# Patient Record
Sex: Male | Born: 1965 | Race: Black or African American | Hispanic: No | Marital: Single | Smoking: Never smoker
Health system: Southern US, Community
[De-identification: ages and names within clinical notes are randomized; demographics above are authoritative.]

---

## 2006-06-18 ENCOUNTER — Emergency Department: Payer: Self-pay | Admitting: Emergency Medicine

## 2010-03-10 ENCOUNTER — Emergency Department: Payer: Self-pay | Admitting: Emergency Medicine

## 2010-10-10 ENCOUNTER — Emergency Department: Payer: Self-pay | Admitting: Emergency Medicine

## 2015-01-28 DIAGNOSIS — D649 Anemia, unspecified: Secondary | ICD-10-CM | POA: Insufficient documentation

## 2015-03-06 DIAGNOSIS — E538 Deficiency of other specified B group vitamins: Secondary | ICD-10-CM | POA: Insufficient documentation

## 2016-06-07 ENCOUNTER — Emergency Department: Payer: Managed Care, Other (non HMO)

## 2016-06-07 ENCOUNTER — Emergency Department
Admission: EM | Admit: 2016-06-07 | Discharge: 2016-06-07 | Disposition: A | Discharge status: Home / Self Care | Payer: Managed Care, Other (non HMO) | Attending: Emergency Medicine | Admitting: Emergency Medicine

## 2016-06-07 ENCOUNTER — Encounter: Payer: Self-pay | Admitting: Emergency Medicine

## 2016-06-07 DIAGNOSIS — J40 Bronchitis, not specified as acute or chronic: Secondary | ICD-10-CM | POA: Diagnosis not present

## 2016-06-07 DIAGNOSIS — R05 Cough: Secondary | ICD-10-CM

## 2016-06-07 DIAGNOSIS — M940 Chondrocostal junction syndrome [Tietze]: Secondary | ICD-10-CM

## 2016-06-07 DIAGNOSIS — R059 Cough, unspecified: Secondary | ICD-10-CM

## 2016-06-07 MED ORDER — ALBUTEROL SULFATE HFA 108 (90 BASE) MCG/ACT IN AERS: 2.0000 | INHALATION_SPRAY | Freq: Four times a day (QID) | RESPIRATORY_TRACT | 0 refills | Status: DC | PRN

## 2016-06-07 MED ORDER — KETOROLAC TROMETHAMINE 60 MG/2ML IM SOLN
60.0000 mg | Freq: Once | INTRAMUSCULAR | Status: AC
  Administered 2016-06-07: 60 mg via INTRAMUSCULAR
  Filled 2016-06-07: qty 2

## 2016-06-07 MED ORDER — BENZONATATE 100 MG PO CAPS
100.0000 mg | ORAL_CAPSULE | Freq: Once | ORAL | Status: AC
  Administered 2016-06-07: 100 mg via ORAL
  Filled 2016-06-07: qty 1

## 2016-06-07 MED ORDER — PREDNISONE 20 MG PO TABS: 60.0000 mg | ORAL_TABLET | Freq: Every day | ORAL | 0 refills | Status: DC

## 2016-06-07 MED ORDER — IPRATROPIUM-ALBUTEROL 0.5-2.5 (3) MG/3ML IN SOLN
3.0000 mL | Freq: Once | RESPIRATORY_TRACT | Status: AC
  Administered 2016-06-07: 3 mL via RESPIRATORY_TRACT
  Filled 2016-06-07: qty 3

## 2016-06-07 MED ORDER — PREDNISONE 20 MG PO TABS
60.0000 mg | ORAL_TABLET | Freq: Once | ORAL | Status: AC
  Administered 2016-06-07: 60 mg via ORAL
  Filled 2016-06-07: qty 3

## 2016-06-07 MED ORDER — OXYMETAZOLINE HCL 0.05 % NA SOLN
1.0000 | Freq: Once | NASAL | Status: AC
  Administered 2016-06-07: 1 via NASAL
  Filled 2016-06-07: qty 15

## 2016-06-07 MED ORDER — BENZONATATE 100 MG PO CAPS: 100.0000 mg | ORAL_CAPSULE | Freq: Four times a day (QID) | ORAL | 0 refills | Status: DC | PRN

## 2016-06-07 NOTE — ED Triage Notes (Signed)
Pt presents to ED with congestion and non-productive cough for the past week. Denies fever at home. Denies nausea or vomiting. +headache. pt reports he has not been sleeping well at home  Pt currently has no increased work of breathing or acute distress noted at this time.

## 2016-06-07 NOTE — ED Provider Notes (Signed)
[facility] Emergency Department Provider Note   ____________________________________________   First MD Initiated Contact with Patient 06/07/16 [phone]     (approximate)  I have reviewed the triage vital signs and the nursing notes.   HISTORY  Chief Complaint Cough and Headache    HPI Ricardo Singleton is a 50 y.o. male who comes into the hospital today with a cough. He reports he has been coughing all week long. He reports he cough so bad tonight that he couldn't catch his breath so he decided to come into the hospital to get checked out. The patient has been taking Delsym, Robitussin, cough medicine and mucus relief and he reports that nothing is been helping. He reports that he's also had some headache with some chest soreness. He denies any fever but has had some nasal and sinus congestion. The patient denies some nausea or vomiting. He rates his headache a 7-8 out of 10 in intensity in his chest discomfort a 4-5 out of 10. He reports that the chest hurts in the middle of his chest and is worse when he is coughing. The patient denies any sick contacts. He reports this chest discomfort as soreness more than anything. The patient decided to come into the hospital to get checked out.   History reviewed. No pertinent past medical history.  There are no active problems to display for this patient.   History reviewed. No pertinent surgical history.  Prior to Admission medications   Medication Sig Start Date End Date Taking? Authorizing Provider  albuterol (PROVENTIL HFA;VENTOLIN HFA) 108 (90 Base) MCG/ACT inhaler Inhale 2 puffs into the lungs every 6 (six) hours as needed for wheezing or shortness of breath. 06/07/16   Rebecka ApleyAllison P Webster, MD  benzonatate (TESSALON PERLES) 100 MG capsule Take 1 capsule (100 mg total) by mouth every 6 (six) hours as needed for cough. 06/07/16   Rebecka ApleyAllison P Webster, MD  predniSONE (DELTASONE) 20 MG tablet Take 3 tablets (60 mg total) by  mouth daily. 06/07/16   Rebecka ApleyAllison P Webster, MD    Allergies Review of patient's allergies indicates no known allergies.  No family history on file.  Social History Social History  Substance Use Topics  . Smoking status: Never Smoker  . Smokeless tobacco: Current User  . Alcohol use No    Review of Systems Constitutional: No fever/chills Eyes: No visual changes. ENT: No sore throat. Cardiovascular:  chest pain. Respiratory: Cough and shortness of breath. Gastrointestinal: No abdominal pain.  No nausea, no vomiting.  No diarrhea.  No constipation. Genitourinary: Negative for dysuria. Musculoskeletal: Negative for back pain. Skin: Negative for rash. Neurological: Headache  10-point ROS otherwise negative.  ____________________________________________   PHYSICAL EXAM:  VITAL SIGNS: ED Triage Vitals  Enc Vitals Group     BP 06/07/16 0547 (!) 162/103     Pulse Rate 06/07/16 0547 63     Resp 06/07/16 0547 18     Temp 06/07/16 0547 97.9 F (36.6 C)     Temp Source 06/07/16 0547 Oral     SpO2 06/07/16 0547 98 %     Weight 06/07/16 0548 170 lb (77.1 kg)     Height 06/07/16 0548 5\' 7"  (1.702 m)     Head Circumference --      Peak Flow --      Pain Score 06/07/16 0550 7     Pain Loc --      Pain Edu? --      Excl. in GC? --  Constitutional: Alert and oriented. Well appearing and in Mild distress. Eyes: Conjunctivae are normal. PERRL. EOMI. Head: Atraumatic. Nose: No congestion/rhinnorhea. Mouth/Throat: Mucous membranes are moist.  Oropharynx non-erythematous. Cardiovascular: Normal rate, regular rhythm. Grossly normal heart sounds.  Good peripheral circulation. Respiratory: Normal respiratory effort.  No retractions. Mildly diminished. Chest tenderness to palpation Gastrointestinal: Soft and nontender. No distention. No abdominal bruits. No CVA tenderness. Musculoskeletal: No lower extremity tenderness nor edema.   Neurologic:  Normal speech and language.  Skin:   Skin is warm, dry and intact. Psychiatric: Mood and affect are normal.   ____________________________________________   LABS (all labs ordered are listed, but only abnormal results are displayed)  Labs Reviewed - No data to display ____________________________________________  EKG  None ____________________________________________  RADIOLOGY  Chest x-ray ____________________________________________   PROCEDURES  Procedure(s) performed: None  Procedures  Critical Care performed: No  ____________________________________________   INITIAL IMPRESSION / ASSESSMENT AND PLAN / ED COURSE  Pertinent labs & imaging results that were available during my care of the patient were reviewed by me and considered in my medical decision making (see chart for details).  This is a 50 year old male who comes into the hospital today with some cough and sinus congestion as well as some shortness of breath after coughing and chest discomfort after coughing. The patient had a chest x-ray done which did not show any pneumonia. As the patient had some mildly diminished sounds at the give him a DuoNeb treatment as well as some prednisone, Toradol and benzonatate. The patient did not have any significant coughing while here in the emergency department. After the breathing treatment the patient had some mild wheezing and reports it is breathing is a little bit improved. He still has no significant coughing. I feel the patient has some bronchitis but as he is not a smoker and he does not have any pneumonia I do not feel like he needs an antibiotic. I will discharge the patient some steroids as well as an inhaler and he will also receive some cough medicine. The patient will be discharged to follow-up with the acute care clinic this week. The patient has no further questions or concerns reports that he is ready to go home.  Clinical Course  Value Comment By Time  DG Chest 2 View Mild elevation of the  left hemidiaphragm. Lungs remain grossly clear   Rebecka Apley, MD 09/03 [phone]     ____________________________________________   FINAL CLINICAL IMPRESSION(S) / ED DIAGNOSES  Final diagnoses:  Bronchitis  Cough  Costochondritis      NEW MEDICATIONS STARTED DURING THIS VISIT:  New Prescriptions   ALBUTEROL (PROVENTIL HFA;VENTOLIN HFA) 108 (90 BASE) MCG/ACT INHALER    Inhale 2 puffs into the lungs every 6 (six) hours as needed for wheezing or shortness of breath.   BENZONATATE (TESSALON PERLES) 100 MG CAPSULE    Take 1 capsule (100 mg total) by mouth every 6 (six) hours as needed for cough.   PREDNISONE (DELTASONE) 20 MG TABLET    Take 3 tablets (60 mg total) by mouth daily.     Note:  This document was prepared using Dragon voice recognition software and may include unintentional dictation errors.    Rebecka Apley, MD 06/07/16 [phone]

## 2017-06-23 IMAGING — CR DG CHEST 2V
2 series · 2 of 2 positions shown · non-contrast
Comparison: None.

CLINICAL DATA: Acute onset of productive cough.  Initial encounter.

EXAM:
CHEST  2 VIEW

[chest pa]
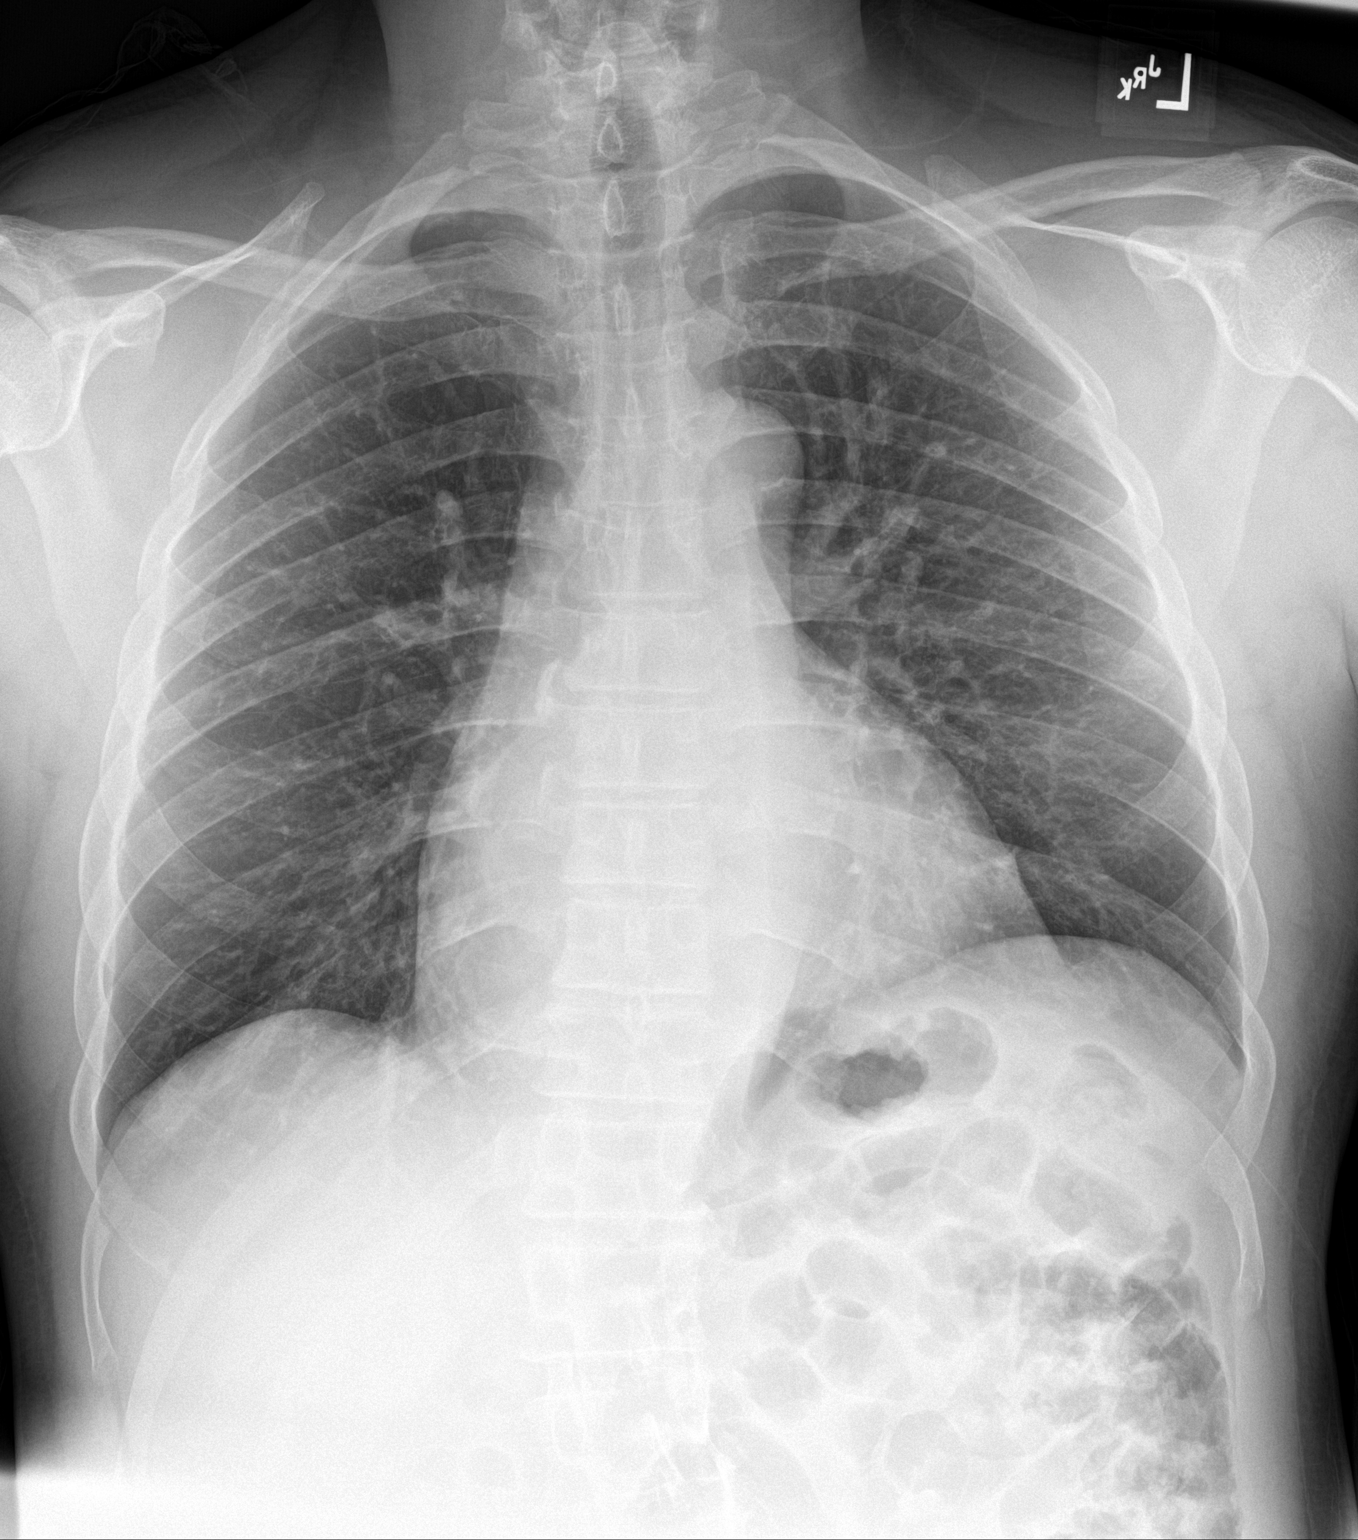

[chest lat]
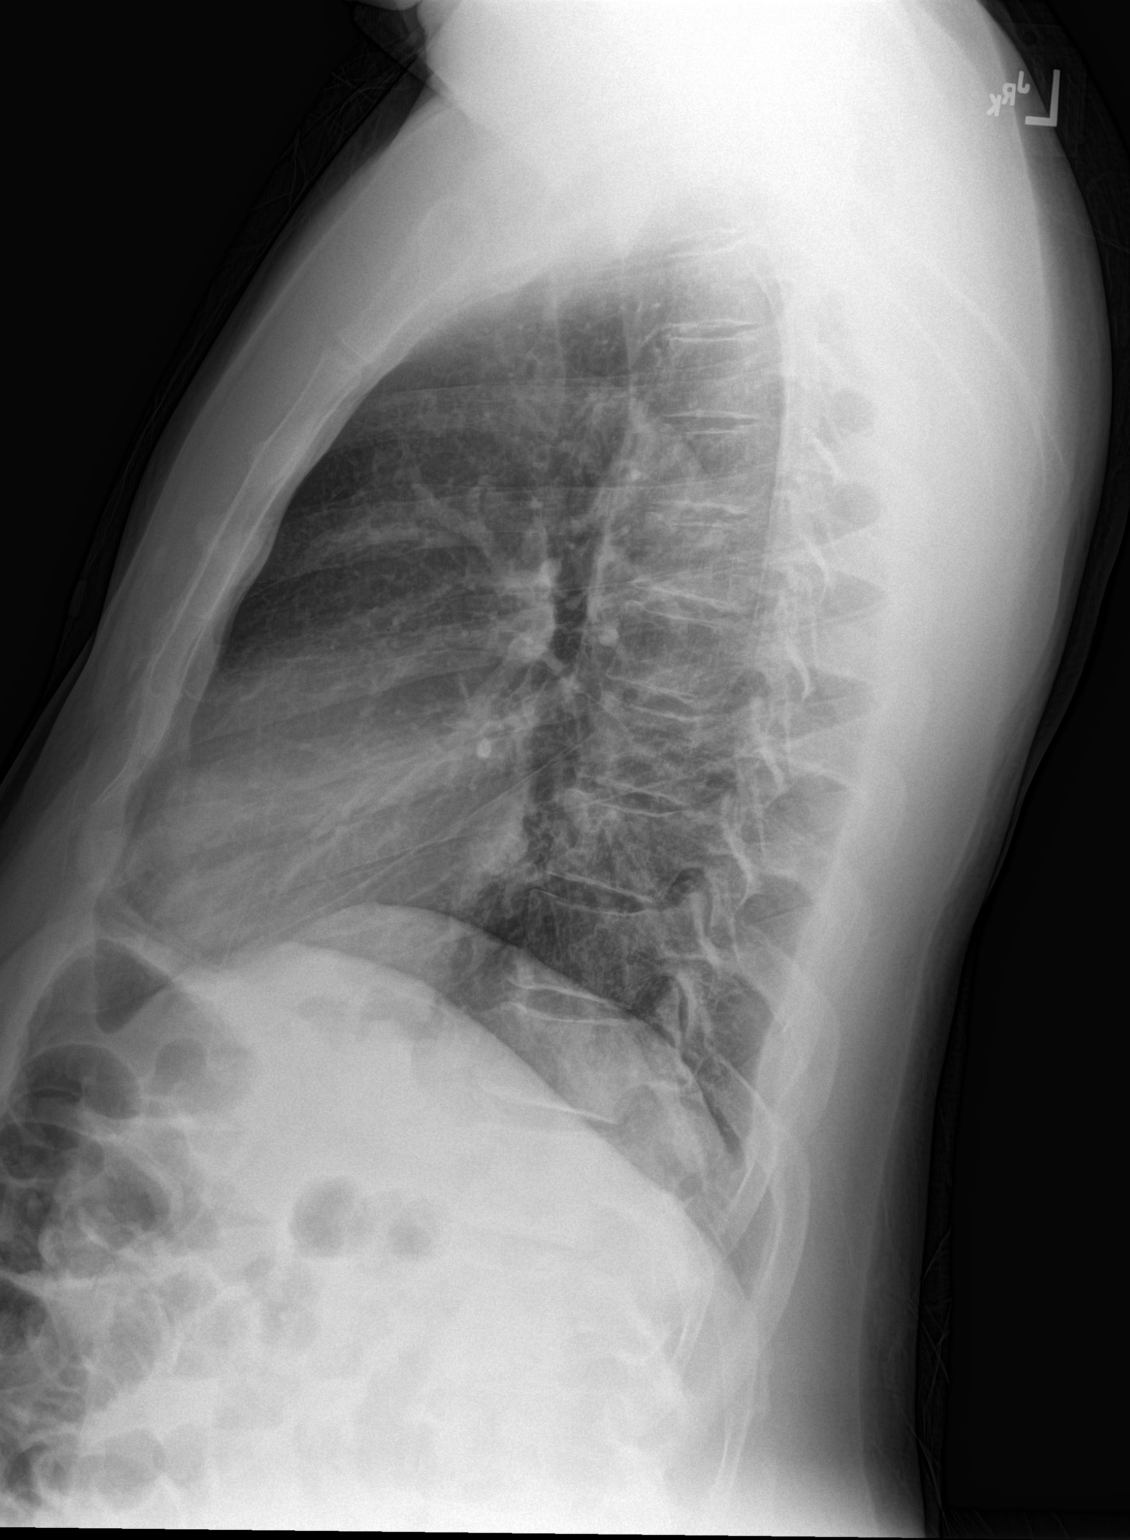

[2 of 2 positions shown; findings below may reference images not displayed]

FINDINGS: The lungs are well-aerated. There is mild elevation of the left
hemidiaphragm. Pulmonary vascularity is at the upper limits of
normal. There is no evidence of focal opacification, pleural
effusion or pneumothorax.

The heart is normal in size; the mediastinal contour is within
normal limits. No acute osseous abnormalities are seen.
IMPRESSION: Mild elevation of the left hemidiaphragm. Lungs remain grossly
clear.

## 2019-11-02 ENCOUNTER — Other Ambulatory Visit: Payer: Self-pay

## 2019-11-02 ENCOUNTER — Ambulatory Visit (INDEPENDENT_AMBULATORY_CARE_PROVIDER_SITE_OTHER): Payer: 59

## 2019-11-02 ENCOUNTER — Encounter: Payer: Self-pay | Admitting: Podiatry

## 2019-11-02 ENCOUNTER — Ambulatory Visit (INDEPENDENT_AMBULATORY_CARE_PROVIDER_SITE_OTHER): Payer: 59 | Admitting: Podiatry

## 2019-11-02 DIAGNOSIS — M79671 Pain in right foot: Secondary | ICD-10-CM

## 2019-11-02 DIAGNOSIS — M722 Plantar fascial fibromatosis: Secondary | ICD-10-CM

## 2019-11-06 ENCOUNTER — Encounter: Payer: Self-pay | Admitting: Podiatry

## 2019-11-06 NOTE — Progress Notes (Signed)
  Subjective:  Patient ID: Ricardo Singleton, male    DOB: 11/30/1965,  MRN: [id number]  Chief Complaint  Patient presents with  . Foot Pain    Patient presents today for right heel/lateral side foot pain x 6-8 months.  He states "its really painful when I get up in the mornings and by the end of the day it throbs.  It feels like something is stabbing my foot"  He has tried Ibuprofen, Tylenol, ice, Epson salt soaks with no relief    54 y.o. male presents with the above complaint.  Patient presents with right heel pain.  Has been going on for 6 to 8 months.  Is painful in the morning and gets worse throughout the day.  He has tried Epson salt soaks ibuprofen Tylenol and ice without any help.  He would like to know if there is anything else that could be done for this he denies seeing anyone else for this.   Review of Systems: Negative except as noted in the HPI. Denies N/V/F/Ch.  History reviewed. No pertinent past medical history.  Current Outpatient Medications:  .  albuterol (VENTOLIN HFA) 108 (90 Base) MCG/ACT inhaler, Inhale into the lungs every 6 (six) hours as needed for wheezing or shortness of breath., Disp: , Rfl:   Social History   Tobacco Use  Smoking Status Never Smoker  Smokeless Tobacco Current User    No Known Allergies Objective:  There were no vitals filed for this visit. There is no height or weight on file to calculate BMI. Constitutional Well developed. Well nourished.  Vascular Dorsalis pedis pulses palpable bilaterally. Posterior tibial pulses palpable bilaterally. Capillary refill normal to all digits.  No cyanosis or clubbing noted. Pedal hair growth normal.  Neurologic Normal speech. Oriented to person, place, and time. Epicritic sensation to light touch grossly present bilaterally.  Dermatologic Nails well groomed and normal in appearance. No open wounds. No skin lesions.  Orthopedic: Normal joint ROM without pain or crepitus bilaterally. No visible  deformities. Tender to palpation at the calcaneal tuber right. No pain with calcaneal squeeze right. Ankle ROM full range of motion right. Silfverskiold Test: negative right.   Radiographs: Taken and reviewed. No acute fractures or dislocations. No evidence of stress fracture.  Plantar heel spur absent. Posterior heel spur absent.   Assessment:   1. Foot pain, right   2. Plantar fasciitis of right foot    Plan:  Patient was evaluated and treated and all questions answered.  Plantar Fasciitis, right - XR reviewed as above.  - Educated on icing and stretching. Instructions given.  - Injection delivered to the plantar fascia as below. - DME: Plantar Fascial Brace - Pharmacologic management: Meloxicam/Medrol Dose Pak. Educated on risks/benefits and proper taking of medication.  Procedure: Injection Tendon/Ligament Location: Right plantar fascia at the glabrous junction; medial approach. Skin Prep: alcohol Injectate: 0.5 cc 0.5% marcaine plain, 0.5 cc of 1% Lidocaine, 0.5 cc kenalog 10. Disposition: Patient tolerated procedure well. Injection site dressed with a band-aid.  No follow-ups on file.

## 2019-12-05 ENCOUNTER — Ambulatory Visit: Payer: 59 | Admitting: Podiatry

## 2019-12-26 ENCOUNTER — Other Ambulatory Visit: Payer: Self-pay

## 2019-12-26 ENCOUNTER — Emergency Department
Admission: EM | Admit: 2019-12-26 | Discharge: 2019-12-26 | Disposition: A | Discharge status: Home / Self Care | Payer: Managed Care, Other (non HMO) | Attending: Emergency Medicine | Admitting: Emergency Medicine

## 2019-12-26 ENCOUNTER — Encounter: Payer: Self-pay | Admitting: *Deleted

## 2019-12-26 ENCOUNTER — Emergency Department
Admission: EM | Admit: 2019-12-26 | Discharge: 2019-12-27 | Disposition: A | Discharge status: Home / Self Care | Payer: 59 | Attending: Emergency Medicine | Admitting: Emergency Medicine

## 2019-12-26 DIAGNOSIS — F99 Mental disorder, not otherwise specified: Secondary | ICD-10-CM | POA: Diagnosis present

## 2019-12-26 DIAGNOSIS — X018XXA Other exposure to uncontrolled fire, not in building or structure, initial encounter: Secondary | ICD-10-CM | POA: Insufficient documentation

## 2019-12-26 DIAGNOSIS — F1414 Cocaine abuse with cocaine-induced mood disorder: Secondary | ICD-10-CM | POA: Diagnosis not present

## 2019-12-26 DIAGNOSIS — R45851 Suicidal ideations: Secondary | ICD-10-CM | POA: Insufficient documentation

## 2019-12-26 DIAGNOSIS — F1910 Other psychoactive substance abuse, uncomplicated: Secondary | ICD-10-CM | POA: Insufficient documentation

## 2019-12-26 DIAGNOSIS — T23212A Burn of second degree of left thumb (nail), initial encounter: Secondary | ICD-10-CM | POA: Insufficient documentation

## 2019-12-26 DIAGNOSIS — Y929 Unspecified place or not applicable: Secondary | ICD-10-CM | POA: Insufficient documentation

## 2019-12-26 DIAGNOSIS — T23211A Burn of second degree of right thumb (nail), initial encounter: Secondary | ICD-10-CM | POA: Insufficient documentation

## 2019-12-26 DIAGNOSIS — Y999 Unspecified external cause status: Secondary | ICD-10-CM | POA: Insufficient documentation

## 2019-12-26 DIAGNOSIS — Y939 Activity, unspecified: Secondary | ICD-10-CM | POA: Insufficient documentation

## 2019-12-26 DIAGNOSIS — T23229A Burn of second degree of unspecified single finger (nail) except thumb, initial encounter: Secondary | ICD-10-CM

## 2019-12-26 DIAGNOSIS — Z23 Encounter for immunization: Secondary | ICD-10-CM | POA: Insufficient documentation

## 2019-12-26 LAB — COMPREHENSIVE METABOLIC PANEL
ALT: 20 U/L (ref 0–44)
AST: 20 U/L (ref 15–41)
Albumin: 4.3 g/dL (ref 3.5–5.0)
Alkaline Phosphatase: 60 U/L (ref 38–126)
Anion gap: 10 (ref 5–15)
BUN: 23 mg/dL — ABNORMAL HIGH (ref 6–20)
CO2: 27 mmol/L (ref 22–32)
Calcium: 9.4 mg/dL (ref 8.9–10.3)
Chloride: 101 mmol/L (ref 98–111)
Creatinine, Ser: 0.87 mg/dL (ref 0.61–1.24)
GFR calc Af Amer: >60 mL/min (ref >60)
GFR calc non Af Amer: >60 mL/min (ref >60)
Glucose, Bld: 148 mg/dL — ABNORMAL HIGH (ref 70–99)
Potassium: 3.5 mmol/L (ref 3.5–5.1)
Sodium: 138 mmol/L (ref 135–145)
Total Bilirubin: 0.7 mg/dL (ref 0.3–1.2)
Total Protein: 7.9 g/dL (ref 6.5–8.1)

## 2019-12-26 LAB — URINE DRUG SCREEN, QUALITATIVE (ARMC ONLY)
Amphetamines, Ur Screen: NOT DETECTED
Barbiturates, Ur Screen: NOT DETECTED
Benzodiazepine, Ur Scrn: NOT DETECTED
Cannabinoid 50 Ng, Ur ~~LOC~~: NOT DETECTED
Cocaine Metabolite,Ur ~~LOC~~: POSITIVE — AB
MDMA (Ecstasy)Ur Screen: NOT DETECTED
Methadone Scn, Ur: NOT DETECTED
Opiate, Ur Screen: NOT DETECTED
Phencyclidine (PCP) Ur S: NOT DETECTED
Tricyclic, Ur Screen: NOT DETECTED

## 2019-12-26 LAB — ETHANOL: Alcohol, Ethyl (B): <10 mg/dL (ref <10)

## 2019-12-26 LAB — CBC
HCT: 42.4 % (ref 39.0–52.0)
Hemoglobin: 13.1 g/dL (ref 13.0–17.0)
MCH: 23.8 pg — ABNORMAL LOW (ref 26.0–34.0)
MCHC: 30.9 g/dL (ref 30.0–36.0)
MCV: 77.1 fL — ABNORMAL LOW (ref 80.0–100.0)
Platelets: 249 10*3/uL (ref 150–400)
RBC: 5.5 MIL/uL (ref 4.22–5.81)
RDW: 13 % (ref 11.5–15.5)
WBC: 6.2 10*3/uL (ref 4.0–10.5)
nRBC: 0 % (ref 0.0–0.2)

## 2019-12-26 MED ORDER — BACITRACIN ZINC 500 UNIT/GM EX OINT
TOPICAL_OINTMENT | Freq: Once | CUTANEOUS | Status: AC
  Administered 2019-12-26: 2 via TOPICAL
  Filled 2019-12-26: qty 1.8

## 2019-12-26 MED ORDER — TETANUS-DIPHTH-ACELL PERTUSSIS 5-2.5-18.5 LF-MCG/0.5 IM SUSP
0.5000 mL | Freq: Once | INTRAMUSCULAR | Status: AC
  Administered 2019-12-26: 0.5 mL via INTRAMUSCULAR
  Filled 2019-12-26: qty 0.5

## 2019-12-26 MED ORDER — BACITRACIN ZINC 500 UNIT/GM EX OINT: TOPICAL_OINTMENT | Freq: Two times a day (BID) | CUTANEOUS | Status: DC

## 2019-12-26 MED ORDER — ACETAMINOPHEN 500 MG PO TABS
1000.0000 mg | ORAL_TABLET | Freq: Once | ORAL | Status: AC
  Administered 2019-12-26: 1000 mg via ORAL
  Filled 2019-12-26: qty 2
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
## 2019-12-26 NOTE — ED Notes (Signed)
Pt given list of resources (detox, homeless shelter) sent from CSW.

## 2019-12-26 NOTE — BH Assessment (Signed)
Assessment Note  Ricardo Singleton is an 54 y.o. male. male with history of substance abuse who comes in with wanting to detox. Pt requesting assistance with arranging detox. Patient states that he has been binging on crack for the past 1 week.  He states he used 200 to 300 dollars worth of crack daily. Pt also reports that in addition to the aforementioned concerns her reported to [facility] on today and verbalized that he felt suicidal. Pt now denies ever feeling this was. He states " I guess I just wanted attention, be cause I felt bad about being on a binge." Pt reports living with his girlfriend and state that he is disappointed because he was scheduled to start a new job on Monday which he missed due to being high. Pt. denies any suicidal ideation, plan or intent.  Pt. denies the presence of any auditory or visual hallucinations at this time.  Pt has denied any previous history of suicide attempts. A behavioral health assessment has been completed including evaluation of the patient, collecting collateral history:, reviewing available medical/clinic records, evaluating his unique risk and protective factors, and discussing treatment recommendations.   Pt UDS (+) cocaine      Diagnosis: Substance induced mood disorder   Past Medical History: History reviewed. No pertinent past medical history.  History reviewed. No pertinent surgical history.  Family History: History reviewed. No pertinent family history.  Social History:  reports that he has never smoked. He uses smokeless tobacco. He reports current alcohol use. He reports current drug use. Drug: Cocaine.  Additional Social History:  Alcohol / Drug Use Pain Medications: SEE PTA Prescriptions: SEE PTA Over the Counter: SEE PTA History of alcohol / drug use?: Yes Longest period of sobriety (when/how long): Couple months Substance #1 Name of Substance 1: Cocaine 1 - Age of First Use: 39 1 - Amount (size/oz): until I am broke 1 - Frequency:  every couple of months 1 - Duration: ongoing 1 - Last Use / Amount: PTA  CIWA: CIWA-Ar BP: 125/76 Pulse Rate: 74 COWS:    Allergies: No Known Allergies  Home Medications: (Not in a hospital admission)   OB/GYN Status:  No LMP for male patient.  General Assessment Data Location of Assessment: [facility] ED TTS Assessment: In system Is this a Tele or Face-to-Face Assessment?: Tele Assessment Is this an Initial Assessment or a Re-assessment for this encounter?: Initial Assessment Patient Accompanied by:: N/A Language Other than English: No Living Arrangements: Other (Comment) What gender do you identify as?: Male Living Arrangements: Spouse/significant other Can pt return to current living arrangement?: Yes Admission Status: Involuntary Petitioner: Other Is patient capable of signing voluntary admission?: No Referral Source: Self/Family/Friend Insurance type: None   Medical Screening Exam [facility] Walk-in ONLY) Medical Exam completed: Yes  Crisis Care Plan Living Arrangements: Spouse/significant other Name of Psychiatrist: none Name of Therapist: none   Education Status Is patient currently in school?: No Is the patient employed, unemployed or receiving disability?: Employed  Risk to self with the past 6 months Suicidal Ideation: No Has patient been a risk to self within the past 6 months prior to admission? : No Suicidal Intent: No Has patient had any suicidal intent within the past 6 months prior to admission? : No Is patient at risk for suicide?: No, but patient needs Medical Clearance Suicidal Plan?: No Has patient had any suicidal plan within the past 6 months prior to admission? : No Access to Means: No What has been your use of  drugs/alcohol within the last 12 months?: Cocaine  Previous Attempts/Gestures: No How many times?: 0 Triggers for Past Attempts: None known Intentional Self Injurious Behavior: None Family Suicide History: No Recent stressful life  event(s): Conflict (Comment) Persecutory voices/beliefs?: No Depression: Yes Depression Symptoms: Feeling angry/irritable Substance abuse history and/or treatment for substance abuse?: Yes Suicide prevention information given to non-admitted patients: Not applicable  Risk to Others within the past 6 months Homicidal Ideation: No Does patient have any lifetime risk of violence toward others beyond the six months prior to admission? : No Thoughts of Harm to Others: No Current Homicidal Intent: No Current Homicidal Plan: No Access to Homicidal Means: No History of harm to others?: No Assessment of Violence: None Noted Does patient have access to weapons?: No Criminal Charges Pending?: No Does patient have a court date: No Is patient on probation?: No  Psychosis Hallucinations: None noted Delusions: None noted  Mental Status Report Appearance/Hygiene: In scrubs Eye Contact: Fair Motor Activity: Freedom of movement Speech: Slow Level of Consciousness: Alert Mood: Sad Affect: Anxious Anxiety Level: Minimal Thought Processes: Coherent Judgement: Unimpaired Orientation: Time, Person, Place, Situation Obsessive Compulsive Thoughts/Behaviors: None  Cognitive Functioning Concentration: Good Memory: Remote Intact, Recent Intact Insight: Poor Impulse Control: Fair Appetite: Good Have you had any weight changes? : No Change Sleep: No Change Total Hours of Sleep: 4 Vegetative Symptoms: None  ADLScreening [facility] Assessment Services) Patient's cognitive ability adequate to safely complete daily activities?: Yes Patient able to express need for assistance with ADLs?: Yes Independently performs ADLs?: Yes (appropriate for developmental age)  Prior Inpatient Therapy Prior Inpatient Therapy: No Prior Therapy Dates: none   Prior Outpatient Therapy Prior Outpatient Therapy: No Does patient have an ACCT team?: No Does patient have Intensive In-House Services?  : No Does patient  have [facility] services? : No Does patient have P4CC services?: No  ADL Screening (condition at time of admission) Patient's cognitive ability adequate to safely complete daily activities?: Yes Patient able to express need for assistance with ADLs?: Yes Independently performs ADLs?: Yes (appropriate for developmental age)       Abuse/Neglect Assessment (Assessment to be complete while patient is alone) Abuse/Neglect Assessment Can Be Completed: Yes Physical Abuse: Denies Verbal Abuse: Denies Sexual Abuse: Denies Exploitation of patient/patient's resources: Denies Self-Neglect: Denies Values / Beliefs Cultural Requests During Hospitalization: None Spiritual Requests During Hospitalization: None Consults Spiritual Care Consult Needed: No Transition of Care Team Consult Needed: No Advance Directives (For Healthcare) Does Patient Have a Medical Advance Directive?: No Would patient like information on creating a medical advance directive?: No - Patient declined          Disposition:  Disposition Initial Assessment Completed for this Encounter: Yes Patient referred to: Other (Comment)(Conslut with NP )  On Site Evaluation by:   Reviewed with Physician:    Laretta Alstrom 12/26/2019 10:54 PM
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
## 2019-12-26 NOTE — TOC Progression Note (Signed)
Transition of Care [facility]) - Progression Note    Patient Details  Name: Ricardo Singleton MRN: [id number] Date of Birth: [birth year]  [facility] CM/SW Contact  Marina Goodell Phone Number: [phone] 12/26/2019, 4:50 PM  Clinical Narrative:    Received call from Lamar Sprinkles, ED for consult request for patient.  Patient is requesting substance abuse information. As per Annette Stable RN, ED, patient is medically cleared and ready for discharge. This CSW Loss adjuster, chartered, ED [place] [employer] and Annette Stable stated he would print them out and give them to patient.  Resource list contains in formation for rehab facilities and homeless shelter.        Expected Discharge Plan and Services                                                 Social Determinants of Health (SDOH) Interventions    Readmission Risk Interventions No flowsheet data found.

## 2019-12-26 NOTE — ED Provider Notes (Signed)
[facility] Emergency Department Provider Note  ____________________________________________   First MD Initiated Contact with Patient 12/26/19 2207     (approximate)  I have reviewed the triage vital signs and the nursing notes.   HISTORY  Chief Complaint IVC    HPI Ricardo Singleton is a 54 y.o. male who was seen by myself earlier who comes back under IVC from [facility].  Patient states that he was having SI and HI although he denied these previously when I saw the patient.  Patient states that he had a plan to drive his car off a bridge.  However he stated that if he knew that he was going to be brought back to a isolated rooms with walls on all 4 sides that he would not have set any of this.  Unclear exactly when this started.  Patient is denying SI currently.  Unclear what makes it better and worse.              History reviewed. No pertinent past medical history.  Patient Active Problem List   Diagnosis Date Noted  . B12 deficiency 03/06/2015  . Anemia 01/28/2015    History reviewed. No pertinent surgical history.  Prior to Admission medications   Medication Sig Start Date End Date Taking? Authorizing Provider  albuterol (VENTOLIN HFA) 108 (90 Base) MCG/ACT inhaler Inhale into the lungs every 6 (six) hours as needed for wheezing or shortness of breath.    [provider]    Allergies Patient has no known allergies.  History reviewed. No pertinent family history.  Social History Social History   Tobacco Use  . Smoking status: Never Smoker  . Smokeless tobacco: Current User  Substance Use Topics  . Alcohol use: Yes    Comment: 4 beers weekly  . Drug use: Yes    Types: Cocaine    Comment: crack      Review of Systems Constitutional: No fever/chills Eyes: No visual changes. ENT: No sore throat. Cardiovascular: Denies chest pain. Respiratory: Denies shortness of breath. Gastrointestinal: No abdominal pain.  No  nausea, no vomiting.  No diarrhea.  No constipation. Genitourinary: Negative for dysuria. Musculoskeletal: Negative for back pain. Skin: Negative for rash. Neurological: Negative for headaches, focal weakness or numbness. Psych: SI, drug use All other ROS negative ____________________________________________   PHYSICAL EXAM:  VITAL SIGNS: ED Triage Vitals  Enc Vitals Group     BP 12/26/19 2132 125/76     Pulse Rate 12/26/19 2132 74     Resp 12/26/19 2132 16     Temp 12/26/19 2132 98.3 F (36.8 C)     Temp Source 12/26/19 2132 Oral     SpO2 12/26/19 2132 98 %     Weight 12/26/19 2133 160 lb (72.6 kg)     Height 12/26/19 2133 5\' 8"  (1.727 m)     Head Circumference --      Peak Flow --      Pain Score 12/26/19 2133 7     Pain Loc --      Pain Edu? --      Excl. in GC? --     Constitutional: Alert and oriented. Well appearing and in no acute distress. Eyes: Conjunctivae are normal. EOMI. Head: Atraumatic. Nose: No congestion/rhinnorhea. Mouth/Throat: Mucous membranes are moist.   Neck: No stridor. Trachea Midline. FROM Cardiovascular: Normal rate, regular rhythm. Grossly normal heart sounds.  Good peripheral circulation. Respiratory: Normal respiratory effort.  No retractions. Lungs CTAB. Gastrointestinal: Soft and nontender.  No distention. No abdominal bruits.  Musculoskeletal: No lower extremity tenderness nor edema.  No joint effusions. Neurologic:  Normal speech and language. No gross focal neurologic deficits are appreciated.  Skin:  Skin is warm, dry and intact. No rash noted. Psychiatric: Mood and affect are normal. Speech and behavior are normal.  Positive SI GU: Deferred   ____________________________________________   LABS (all labs ordered are listed, but only abnormal results are displayed)  Labs Reviewed - No data to display ____________________________________________     PROCEDURES  Procedure(s) performed (including Critical  Care):  Procedures   ____________________________________________   INITIAL IMPRESSION / ASSESSMENT AND PLAN / ED COURSE  ERIQUE KASER was evaluated in Emergency Department on 12/26/2019 for the symptoms described in the history of present illness. He was evaluated in the context of the global COVID-19 pandemic, which necessitated consideration that the patient might be at risk for infection with the SARS-CoV-2 virus that causes COVID-19. Institutional protocols and algorithms that pertain to the evaluation of patients at risk for COVID-19 are in a state of rapid change based on information released by regulatory bodies including the CDC and federal and state organizations. These policies and algorithms were followed during the patient's care in the ED.     Pt is without any acute medical complaints. No exam findings to suggest medical cause of current presentation. Will order psychiatric screening labs and discuss further w/ psychiatric service.  D/d includes but is not limited to psychiatric disease, behavioral/personality disorder, inadequate socioeconomic support, medical.  Based on HPI, exam, unremarkable labs, no concern for acute medical problem at this time. No rigidity, clonus, hyperthermia, focal neurologic deficit, diaphoresis, tachycardia, meningismus, ataxia, gait abnormality or other finding to suggest this visit represents a non-psychiatric problem. Screening labs reviewed.    Given this, pt medically cleared, to be dispositioned per Psych.        ____________________________________________   FINAL CLINICAL IMPRESSION(S) / ED DIAGNOSES   Final diagnoses:  Suicide ideation      MEDICATIONS GIVEN DURING THIS VISIT:  Medications - No data to display   ED Discharge Orders    None       Note:  This document was prepared using Dragon voice recognition software and may include unintentional dictation errors.   Vanessa Sidney, MD 12/26/19 2219

## 2019-12-26 NOTE — ED Notes (Signed)
Fingers 1 and 2 both hands with broken blisters dressed with bacitracin, gauze wrap and burn net. No s/s of infection. Several small unbroken blisters finger 3 both hands not dressed.

## 2019-12-26 NOTE — ED Notes (Signed)
Pts belongings include - hat, pants, shirt, slippers, wallet and keys. No phone or underwear.

## 2019-12-27 LAB — SALICYLATE LEVEL: Salicylate Lvl: <7 mg/dL — ABNORMAL LOW (ref 7.0–30.0)

## 2019-12-27 LAB — ACETAMINOPHEN LEVEL: Acetaminophen (Tylenol), Serum: <10 ug/mL — ABNORMAL LOW (ref 10–30)

## 2019-12-27 NOTE — ED Notes (Signed)
Referral information for Detox Placement have been faxed to;   [facility]

## 2019-12-27 NOTE — ED Notes (Signed)
Pt was given ice water without lid or straw.

## 2019-12-27 NOTE — ED Provider Notes (Signed)
-----------------------------------------   6:09 AM on 12/27/2019 -----------------------------------------   Blood pressure 125/76, pulse 74, temperature 98.3 F (36.8 C), temperature source Oral, resp. rate 16, height 5\' 8"  (1.727 m), weight 72.6 kg, SpO2 98 %.  The patient is calm and cooperative at this time.  There have been no acute events since the last update.  Awaiting disposition plan from Behavioral Medicine and/or Social Work team(s).   , MD 12/27/19 [phone]

## 2019-12-27 NOTE — Consult Note (Signed)
  Patient reassessed this morning, corroborates the information has given to the NP last night as accurate.  Denies any suicidal or homicidal ideation.  Denies any psychotic symptoms.  Reports feeling safe leaving the hospital at this time.  IVC will be rescinded patient will be discharged to the community.

## 2019-12-27 NOTE — ED Notes (Signed)
Writer advised patient that I spoke with girlfriend and she was wanting to know where her car was. Patient stated, "She don't need to know where that car is, she has two cars."  This Clinical research associate asked again where he left girlfriend's car. Patient stated, "Its in the parking lot."  This writer called Elnita Maxwell and advised patient stated that is was in the parking lot but patient did not state which parking lot.

## 2019-12-27 NOTE — ED Notes (Signed)
Pt was given two packages of graham crackers and two peanut butter.

## 2019-12-27 NOTE — ED Notes (Signed)
Pt was given grape juice with ice without lid or straw.

## 2019-12-27 NOTE — ED Notes (Signed)
Patient asked for this writer to call girlfriend and let her know that he is here.  Patient states he has not talked to her in 6 days. This Clinical research associate questioned patient why he has not contacted her to let her know that he is ok. Patient states, "Cause I have been doing drugs."   This writer called Marletta Lor [phone] like patient asked writer to do and told her patient was here in ER and was safe. Elnita Maxwell began to ask if we knew where her car was. I advised I would ask patient and call her back.

## 2019-12-27 NOTE — ED Notes (Signed)
Pt stated "I have not had a shower in several days. Can I call my girlfriend to bring me some clean clothes and a razor so I can shower and shave?" Pt made aware that he can not have any of his personal belongings in the area that he is in. Pt stated "well I have a shower at home I can just shower there."

## 2019-12-27 NOTE — ED Provider Notes (Signed)
Patient has been seen by psychiatry and cleared for discharge.   Emily Filbert, MD 12/27/19 1021

## 2020-01-03 ENCOUNTER — Other Ambulatory Visit: Payer: Self-pay | Admitting: Podiatry

## 2020-01-03 DIAGNOSIS — M722 Plantar fascial fibromatosis: Secondary | ICD-10-CM
